# Patient Record
Sex: Female | Born: 1985 | Race: White | Hispanic: No | Marital: Single | Smoking: Current every day smoker
Health system: Southern US, Community
[De-identification: ages and names within clinical notes are randomized; demographics above are authoritative.]

## PROBLEM LIST (undated history)

## (undated) HISTORY — PX: BREAST SURGERY: SHX581

---

## 2012-08-10 HISTORY — PX: BREAST ENHANCEMENT SURGERY: SHX7

## 2014-07-01 ENCOUNTER — Encounter: Payer: Self-pay | Admitting: Emergency Medicine

## 2014-07-01 ENCOUNTER — Emergency Department
Admission: EM | Admit: 2014-07-01 | Discharge: 2014-07-01 | Disposition: A | Discharge status: Home / Self Care | Payer: Self-pay | Source: Home / Self Care | Attending: Family Medicine | Admitting: Family Medicine

## 2014-07-01 DIAGNOSIS — K0889 Other specified disorders of teeth and supporting structures: Secondary | ICD-10-CM

## 2014-07-01 DIAGNOSIS — K088 Other specified disorders of teeth and supporting structures: Secondary | ICD-10-CM

## 2014-07-01 MED ORDER — AMOXICILLIN 875 MG PO TABS: 875.0000 mg | ORAL_TABLET | Freq: Two times a day (BID) | ORAL | Status: DC

## 2014-07-01 NOTE — ED Notes (Signed)
Reports onset of pain 4 days ago in cracked (2 years ago) lower right wisdom tooth. Does not have dentist, but understands we are limited in dental care possibilities.

## 2014-07-01 NOTE — Discharge Instructions (Signed)
May continue Ibuprofen 200mg , 4 tabs every 8 hours with food.  May also take Tylenol as needed for pain.   Dental Pain A tooth ache may be caused by cavities (tooth decay). Cavities expose the nerve of the tooth to air and hot or cold temperatures. It may come from an infection or abscess (also called a boil or furuncle) around your tooth. It is also often caused by dental caries (tooth decay). This causes the pain you are having. DIAGNOSIS  Your caregiver can diagnose this problem by exam. TREATMENT   If caused by an infection, it may be treated with medications which kill germs (antibiotics) and pain medications as prescribed by your caregiver. Take medications as directed.  Only take over-the-counter or prescription medicines for pain, discomfort, or fever as directed by your caregiver.  Whether the tooth ache today is caused by infection or dental disease, you should see your dentist as soon as possible for further care. SEEK MEDICAL CARE IF: The exam and treatment you received today has been provided on an emergency basis only. This is not a substitute for complete medical or dental care. If your problem worsens or new problems (symptoms) appear, and you are unable to meet with your dentist, call or return to this location. SEEK IMMEDIATE MEDICAL CARE IF:   You have a fever.  You develop redness and swelling of your face, jaw, or neck.  You are unable to open your mouth.  You have severe pain uncontrolled by pain medicine. MAKE SURE YOU:   Understand these instructions.  Will watch your condition.  Will get help right away if you are not doing well or get worse. Document Released: 05/29/2005 Document Revised: 08/21/2011 Document Reviewed: 01/15/2008 [facility] Patient Information 2015 [employer], [place]. This information is not intended to replace advice given to you by your health care provider. Make sure you discuss any questions you have with your health care provider.

## 2014-07-01 NOTE — ED Provider Notes (Signed)
CSN: [id number]     Arrival date & time 07/01/14  1349 History   First MD Initiated Contact with Patient 07/01/14 1409     Chief Complaint  Patient presents with  . Dental Pain      HPI Comments: Patient has had a cracked and eroded right lower wisdom tooth for about two years.  She has developed pain in this tooth over the past four days.  There has been no swelling.  No fevers, chills, and sweats   Patient is a 29 y.o. female presenting with tooth pain. The history is provided by the patient.  Dental Pain Location:  Lower Lower teeth location:  32/RL 3rd molar Quality:  Aching, dull and constant Severity:  Mild Onset quality:  Gradual Duration:  4 days Timing:  Constant Progression:  Worsening Chronicity:  New Context: dental caries and dental fracture   Relieved by:  Nothing Worsened by:  Touching, pressure and hot food/drink Ineffective treatments:  NSAIDs Associated symptoms: no difficulty swallowing, no drooling, no facial pain, no facial swelling, no fever, no gum swelling, no headaches, no neck pain, no neck swelling, no oral bleeding, no oral lesions and no trismus     History reviewed. No pertinent past medical history. Past Surgical History  Procedure Laterality Date  . Breast surgery      augmentation   Family History  Problem Relation Age of Onset  . Hypertension Mother    History  Substance Use Topics  . Smoking status: Current Every Day Smoker  . Smokeless tobacco: Not on file  . Alcohol Use: No   OB History    No data available     Review of Systems  Constitutional: Negative for fever.  HENT: Negative for drooling, facial swelling and mouth sores.   Musculoskeletal: Negative for neck pain.  Neurological: Negative for headaches.  All other systems reviewed and are negative.   Allergies  Flexeril  Home Medications   Prior to Admission medications   Medication Sig Start Date End Date Taking? Authorizing Provider  amoxicillin (AMOXIL) 875 MG  tablet Take 1 tablet (875 mg total) by mouth 2 (two) times daily. 07/01/14   Lattie HawStephen A Ramon Brant, MD   BP 98/67 mmHg  Pulse 82  Temp(Src) 98.2 F (36.8 C) (Oral)  Resp 16  Ht 5\' 3"  (1.6 m)  Wt 110 lb (49.896 kg)  BMI 19.49 kg/m2  SpO2 97%  LMP 06/12/2014 (Approximate) Physical Exam  Constitutional: She appears well-developed and well-nourished. No distress.  HENT:  Head: Normocephalic.  Right Ear: Tympanic membrane normal.  Left Ear: Tympanic membrane normal.  Nose: Nose normal.  Mouth/Throat: Mucous membranes are normal. No trismus in the jaw. Abnormal dentition. Dental caries present. No dental abscesses or uvula swelling.    Right lower third molar is cracked and eroded and tender to tap.  No gingival swelling  Eyes: Conjunctivae are normal. Pupils are equal, round, and reactive to light.  Neck: Neck supple.  Cardiovascular: Normal heart sounds.   Pulmonary/Chest: Breath sounds normal.  Lymphadenopathy:    She has no cervical adenopathy.  Nursing note and vitals reviewed.   ED Course  Procedures  none   MDM   1. Toothache; ?early periapical abscess    Begin amoxicillin 875mg  BID for 2 weeks. May continue Ibuprofen 200mg , 4 tabs every 8 hours with food.  May also take Tylenol as needed for pain. Followup with dentist as soon as possible.     Lattie HawStephen A Donte Kary, MD 07/02/14 [phone]

## 2015-01-28 ENCOUNTER — Encounter: Payer: Self-pay | Admitting: Osteopathic Medicine

## 2015-01-28 ENCOUNTER — Ambulatory Visit (INDEPENDENT_AMBULATORY_CARE_PROVIDER_SITE_OTHER): Payer: 59 | Admitting: Osteopathic Medicine

## 2015-01-28 VITALS — BP 105/77 | HR 88 | Ht 64.0 in | Wt 111.0 lb

## 2015-01-28 DIAGNOSIS — G44209 Tension-type headache, unspecified, not intractable: Secondary | ICD-10-CM | POA: Diagnosis not present

## 2015-01-28 DIAGNOSIS — M545 Low back pain, unspecified: Secondary | ICD-10-CM

## 2015-01-28 DIAGNOSIS — G8929 Other chronic pain: Secondary | ICD-10-CM | POA: Diagnosis not present

## 2015-01-28 LAB — POCT URINE PREGNANCY: Preg Test, Ur: NEGATIVE

## 2015-01-28 MED ORDER — MELOXICAM 7.5 MG PO TABS: 7.5000 mg | ORAL_TABLET | Freq: Every day | ORAL | Status: AC | PRN

## 2015-01-28 NOTE — Progress Notes (Signed)
HPI: Felicia Rogers is a 29 y.o. female who presents to [facility]  today for back pain  BACK PAIN: CHRONIC - WORSE. Onset: has been hurting since high school, worsening over the past month and she is also experiencing headaches almost daily. Location: lower back. Quality: sharp, comes and goes. Exacerbated by: lying down on abdomen or back. Alleviated by: nothing. In the past has tried: Advil prn to some relief,. Associated symptoms: no vision changes, no sciatica. Previous imaging: in high school probably XR. Has seen PT and Chiropractics before which hadn't helped very much. Pain has no association with menstrual cycle.   HEADACHE: NEW:. Onset: 2 weeks. Location: upper neck/back of head and L side. Alleviated by: Advil, Excedrin. Timing: worse in morning. Associated symptoms: no vision changes,    Past medical, social and family history reviewed: History reviewed. No pertinent past medical history. Past Surgical History  Procedure Laterality Date  . Breast surgery      augmentation  . Breast enhancement surgery  March 2014   Social History  Substance Use Topics  . Smoking status: Current Every Day Smoker -- 0.50 packs/day    Types: Cigarettes  . Smokeless tobacco: Not on file  . Alcohol Use: Yes     Comment: 1x week   Family History  Problem Relation Age of Onset  . Diabetes Mother   . Lung cancer Father     Current Outpatient Prescriptions  Medication Sig Dispense Refill  . meloxicam (MOBIC) 7.5 MG tablet Take 1 tablet (7.5 mg total) by mouth daily as needed for pain (avoid using more than 3 - 4 days per week). 30 tablet 0   No current facility-administered medications for this visit.   Allergies  Allergen Reactions  . Flexeril [Cyclobenzaprine]      Review of Systems: CONSTITUTIONAL: Neg fever/chills, no unintentional weight changes HEAD/EYES/EARS/NOSE: (+) headache as per HPI, (-) vision change or hearing change CARDIAC: No chest  pain/pressure/palpitations, no orthopnea RESPIRATORY: No cough/shortness of breath/wheeze GASTROINTESTINAL: No nausea/vomiting/abdominal pain/blood in stool/diarrhea/constipation MUSCULOSKELETAL: (-) myalgia/arthralgia, (+) back pain as per HPI GENITOURINARY: No incontinence, No abnormal genital bleeding/discharge SKIN: No rash/wounds/concerning lesions HEM/ONC: No easy bruising/bleeding, no abnormal lymph node PSYCHIATRIC: No concerns with depression/anxiety or sleep problems.   Exam:  BP 105/77 mmHg  Pulse 88  Ht  (1.626 m)  Wt 111 lb (50.349 kg)  BMI 19.04 kg/m2  SpO2 97% Constitutional: VSS, see above. General Appearance: alert, well-developed, well-nourished, NAD.  Eyes: Normal lids and conjunctive, non-icteric sclera, PERRLA Ears, Nose, Mouth, Throat: Normal external inspection ears/nares/mouth/lips/gums, Normal TM bilaterally, MMM, posterior pharynx without erythema/exudate Neck: No masses, trachea midline. No thyroid enlargement/tenderness/mass appreciated Respiratory: Normal respiratory effort. No dullness/hyper-resonance to percussion. Breath sounds normal, no wheeze/rhonchi/rales Cardiovascular: S1/S2 normal, no murmur/rub/gallop auscultated. No carotid bruit or JVD. No abdominal aortic bruit. Pedal pulse II/IV bilaterally DP and PT. No lower extremity edema. Gastrointestinal: Nontender, no masses. No hepatomegaly, no splenomegaly. No hernia appreciated. Rectal exam deferred.  Musculoskeletal: Gait normal. No clubbing/cyanosis of digits. SPINE/RIBS/PELVIS: (+) perispinal tenderness worse on L in lumbar and L SI area at level L4 - S2, mild (+) straight leg raise on L at 45 deg but no neck pain, normal ROM Lumbar with mild pain to L rotation, pelvis stable. HEAD/NECK: [place]/At, (+) tenderness to trapezius on L and L C-spine, normal ROM to flex/ext/rot.   Neurological: No cranial nerve deficit on limited exam. Motor and sensation intact and symmetric Psychiatric: Normal  judgment/insight. Normal  mood and affect. Oriented x3.    Results for orders placed or performed in visit on 01/28/15 (from the past 72 hour(s))  POCT urine pregnancy     Status: None   Collection Time: 01/28/15  3:15 PM  Result Value Ref Range   Preg Test, Ur Negative Negative    No results found.   ASSESSMENT/PLAN:  Low back pain of over 3 months duration - Plan: meloxicam (MOBIC) 7.5 MG tablet, DG Lumbar Spine 2-3 Views, DG Sacrum/Coccyx, DG Pelvis 1-2 Views, POCT urine pregnancy neg. patient counseled on home stretching exercises, can return as needed to clinic for osteopathic manipulative treatment. X-rays pending as ordered above. If conservative measures fail, may consider referral for MRI /pain management  Tension headache - Plan: meloxicam (MOBIC) 7.5 MG tablet. Return to clinic if headaches are not improving.  Pt counseled to avoid pregnancy on current medication, RTC if interested in discussion of contraception.   PROCEDURE: OMT performed at visit, myofascial release to c-spine to relief of headache, Myofascial release/HVLA/Sacral rocking to Lumbar spine and Sacro-Iliac joints to relief of back pain.

## 2015-01-29 ENCOUNTER — Ambulatory Visit (INDEPENDENT_AMBULATORY_CARE_PROVIDER_SITE_OTHER): Payer: 59

## 2015-01-29 DIAGNOSIS — M533 Sacrococcygeal disorders, not elsewhere classified: Secondary | ICD-10-CM

## 2015-01-29 DIAGNOSIS — M545 Low back pain, unspecified: Secondary | ICD-10-CM

## 2015-01-29 DIAGNOSIS — R102 Pelvic and perineal pain: Secondary | ICD-10-CM | POA: Diagnosis not present

## 2015-01-29 DIAGNOSIS — G8929 Other chronic pain: Principal | ICD-10-CM

## 2015-04-14 ENCOUNTER — Ambulatory Visit (INDEPENDENT_AMBULATORY_CARE_PROVIDER_SITE_OTHER): Payer: 59 | Admitting: Osteopathic Medicine

## 2015-04-14 ENCOUNTER — Encounter: Payer: Self-pay | Admitting: Osteopathic Medicine

## 2015-04-14 VITALS — BP 105/73 | HR 103 | Temp 99.0°F | Wt 109.8 lb

## 2015-04-14 DIAGNOSIS — J019 Acute sinusitis, unspecified: Secondary | ICD-10-CM

## 2015-04-14 DIAGNOSIS — B9689 Other specified bacterial agents as the cause of diseases classified elsewhere: Secondary | ICD-10-CM

## 2015-04-14 DIAGNOSIS — R05 Cough: Secondary | ICD-10-CM | POA: Diagnosis not present

## 2015-04-14 DIAGNOSIS — R059 Cough, unspecified: Secondary | ICD-10-CM

## 2015-04-14 DIAGNOSIS — G44209 Tension-type headache, unspecified, not intractable: Secondary | ICD-10-CM | POA: Diagnosis not present

## 2015-04-14 MED ORDER — IPRATROPIUM BROMIDE 0.03 % NA SOLN: 2.0000 | Freq: Two times a day (BID) | NASAL | Status: AC

## 2015-04-14 MED ORDER — AMOXICILLIN-POT CLAVULANATE 875-125 MG PO TABS: 1.0000 | ORAL_TABLET | Freq: Two times a day (BID) | ORAL | Status: AC

## 2015-04-14 MED ORDER — BENZONATATE 200 MG PO CAPS: 200.0000 mg | ORAL_CAPSULE | Freq: Three times a day (TID) | ORAL | Status: AC | PRN

## 2015-04-14 NOTE — Progress Notes (Signed)
HPI: Felicia SayreKrystal Hoefle is a 29 y.o. female [facility] [place] [place] for chief complaint of:  Chief Complaint  Patient presents with  . Coughing    x  1weeks   . Nasal Congestion    . Location: sinuses . Quality: worse this week, cough and chest congestion is worse issues, sore throat . Severity: moderate . Duration: 10 days . Timing: constant . Modifying factors: Has taken OTC Day/NyQuil, Sudafed, Mucinex.  . Assoc signs/symptoms: (+) fever intermittent highest 100.5, coughing   Past medical, social and family history reviewed: No past medical history on file. Past Surgical History  Procedure Laterality Date  . Breast surgery      augmentation  . Breast enhancement surgery  March 2014   Social History  Substance Use Topics  . Smoking status: Current Every Day Smoker -- 0.50 packs/day    Types: Cigarettes  . Smokeless tobacco: Not on file  . Alcohol Use: Yes     Comment: 1x week   Family History  Problem Relation Age of Onset  . Diabetes Mother   . Lung cancer Father     Current Outpatient Prescriptions  Medication Sig Dispense Refill  . meloxicam (MOBIC) 7.5 MG tablet Take 1 tablet (7.5 mg total) by mouth daily as needed for pain (avoid using more than 3 - 4 days per week). (Patient not taking: Reported on 04/14/2015) 30 tablet 0   No current facility-administered medications for this visit.   Allergies  Allergen Reactions  . Flexeril [Cyclobenzaprine]       Review of Systems: CONSTITUTIONAL:  Yes  fever, no chills, No  unintentional weight changes HEAD/EYES/EARS/NOSE/THROAT: No headache, no vision change, no hearing change, Yes  sore throat CARDIAC: No chest pain, no pressure/palpitations, no orthopnea RESPIRATORY: Yes  cough, No  shortness of breath/wheeze GASTROINTESTINAL: No nausea, no vomiting, no abdominal pain, no blood in stool, no diarrhea, no constipation MUSCULOSKELETAL: Yes   myalgia/arthralgia GENITOURINARY: No incontinence, No abnormal genital bleeding/discharge SKIN: No rash/wounds/concerning lesions HEM/ONC: No easy bruising/bleeding, no abnormal lymph node ENDOCRINE: No polyuria/polydipsia/polyphagia, no heat/cold intolerance  NEUROLOGIC: No weakness, no dizziness, no slurred speech PSYCHIATRIC: No concerns with depression, no concerns with anxiety, no sleep problems    Exam:  BP 105/73 mmHg  Pulse 103  Temp(Src) 99 F (37.2 C)  Wt 109 lb 12 oz (49.782 kg) Constitutional: VSS, see above. General Appearance: alert, well-developed, well-nourished, NAD Eyes: Normal lids and conjunctive, non-icteric sclera, PERRLA Ears, Nose, Mouth, Throat: Normal external inspection ears/nares/mouth/lips/gums, TM normal bilaterally, MMM, posterior pharynx Yes  erythema No exudate Neck: No masses, trachea midline. No thyroid enlargement/tenderness/mass appreciated. No lymphadenopathy Respiratory: Normal respiratory effort. no wheeze, no rhonchi, no rales Cardiovascular: S1/S2 normal, no murmur, no rub/gallop auscultated. RRR.   No results found for this or any previous visit (from the past 72 hour(s)).    ASSESSMENT/PLAN:  Acute bacterial rhinosinusitis - Plan: amoxicillin-clavulanate (AUGMENTIN) 875-125 MG tablet, ipratropium (ATROVENT) 0.03 % nasal spray  Cough - Plan: benzonatate (TESSALON) 200 MG capsule     Return if symptoms worsen or fail to improve.

## 2016-11-07 IMAGING — CR DG LUMBAR SPINE 2-3V
3 series · 3 of 3 positions shown · non-contrast
Comparison: None.

CLINICAL DATA: Three-month history of progressive lumbago

EXAM:
LUMBAR SPINE - 2-3 VIEW

[l-spine ap]
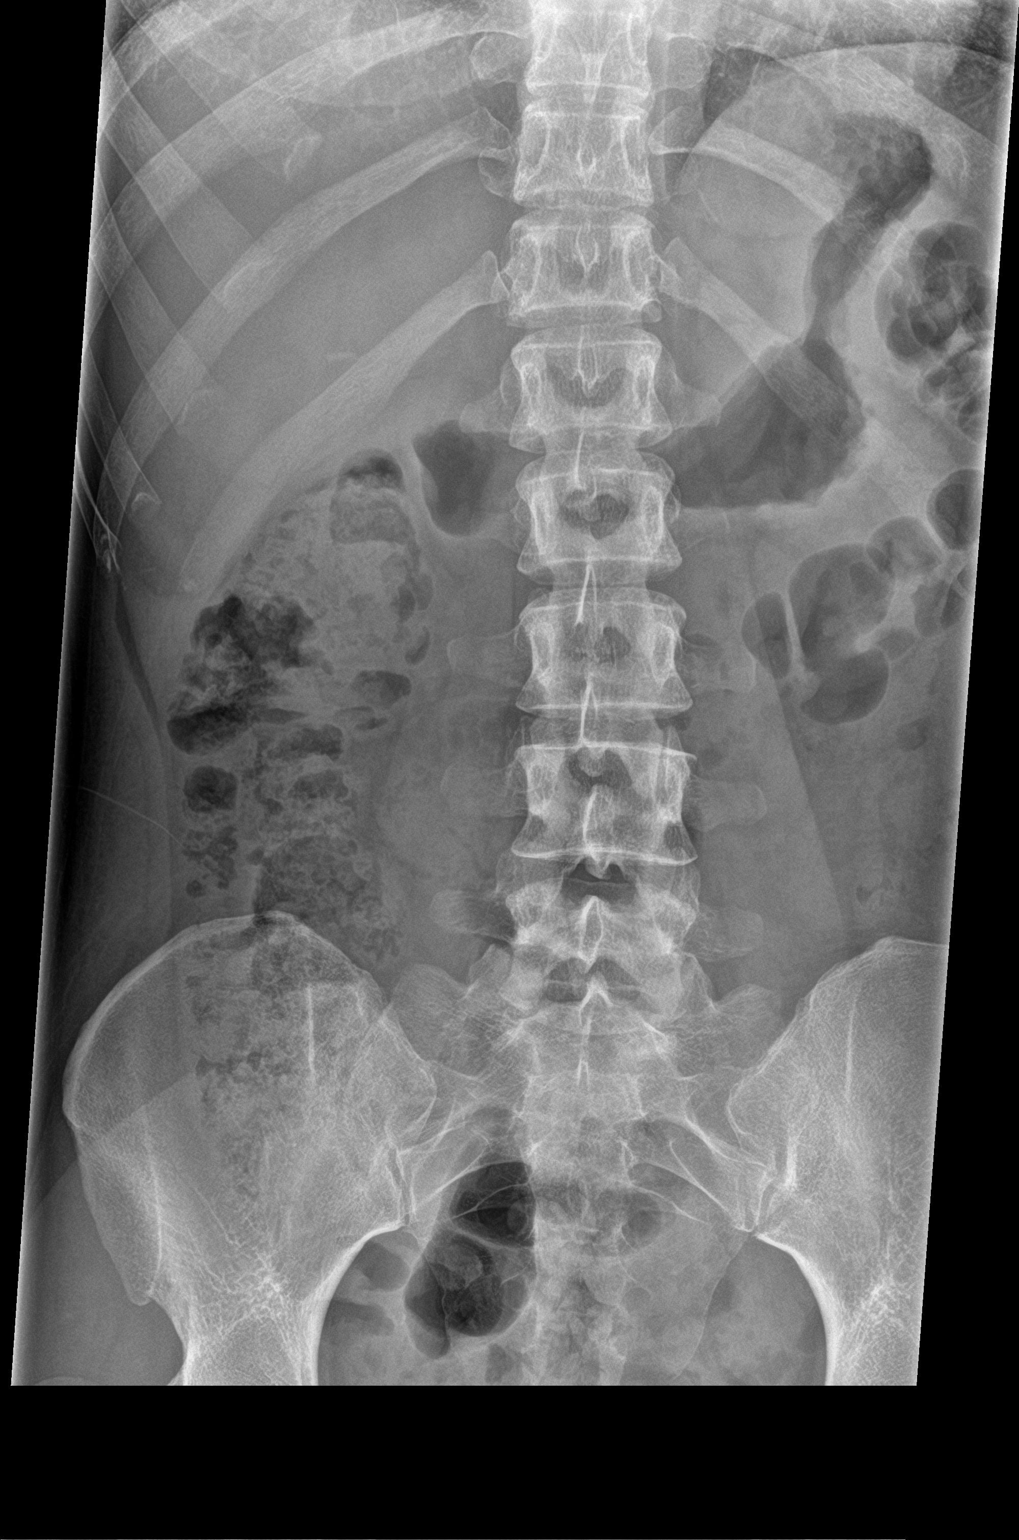

[l-spine lat]
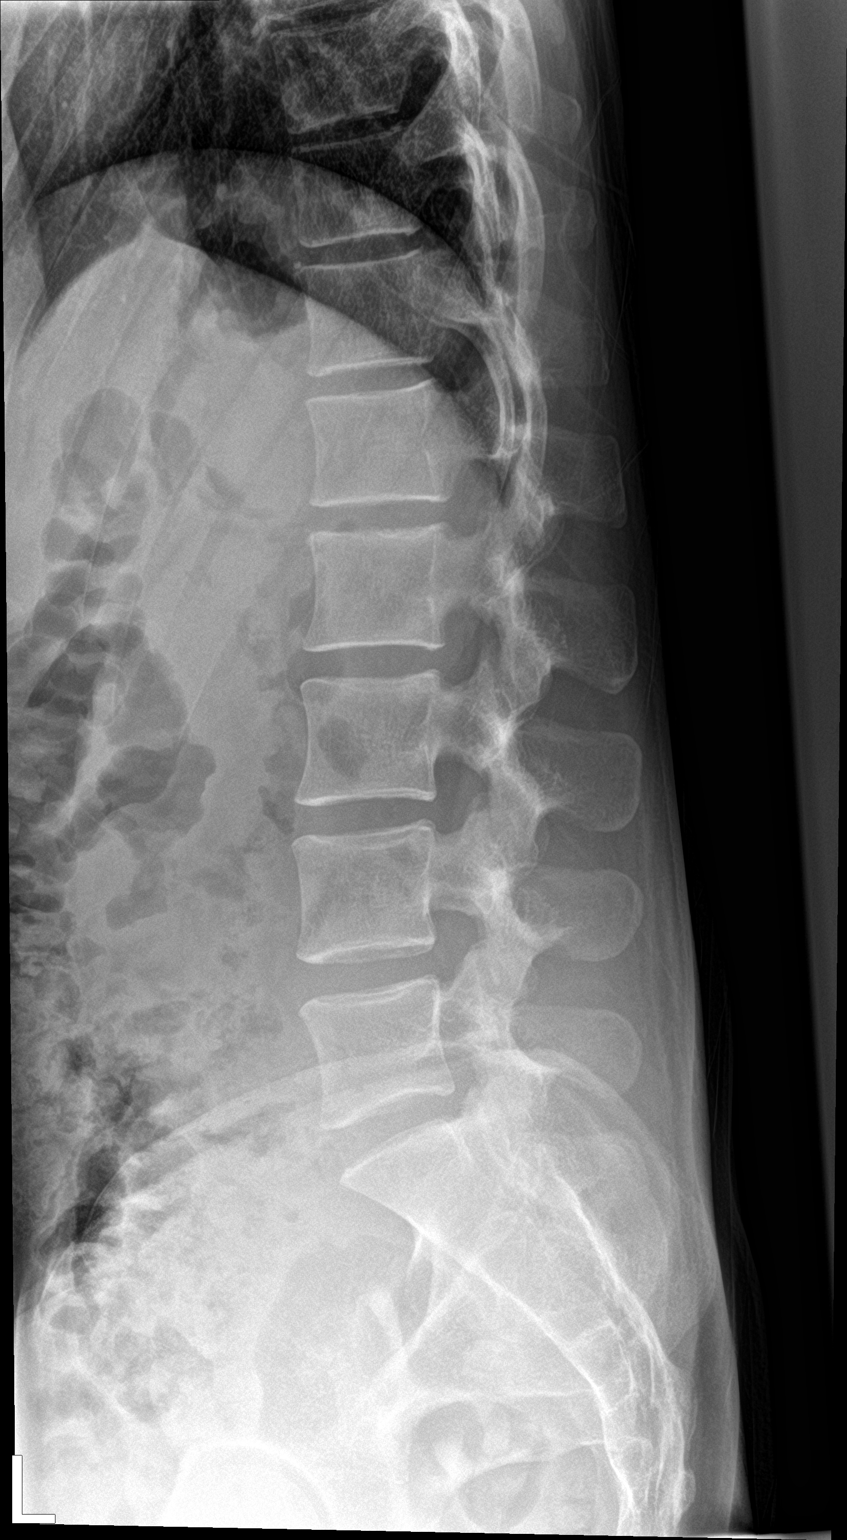

[l-spine spot]
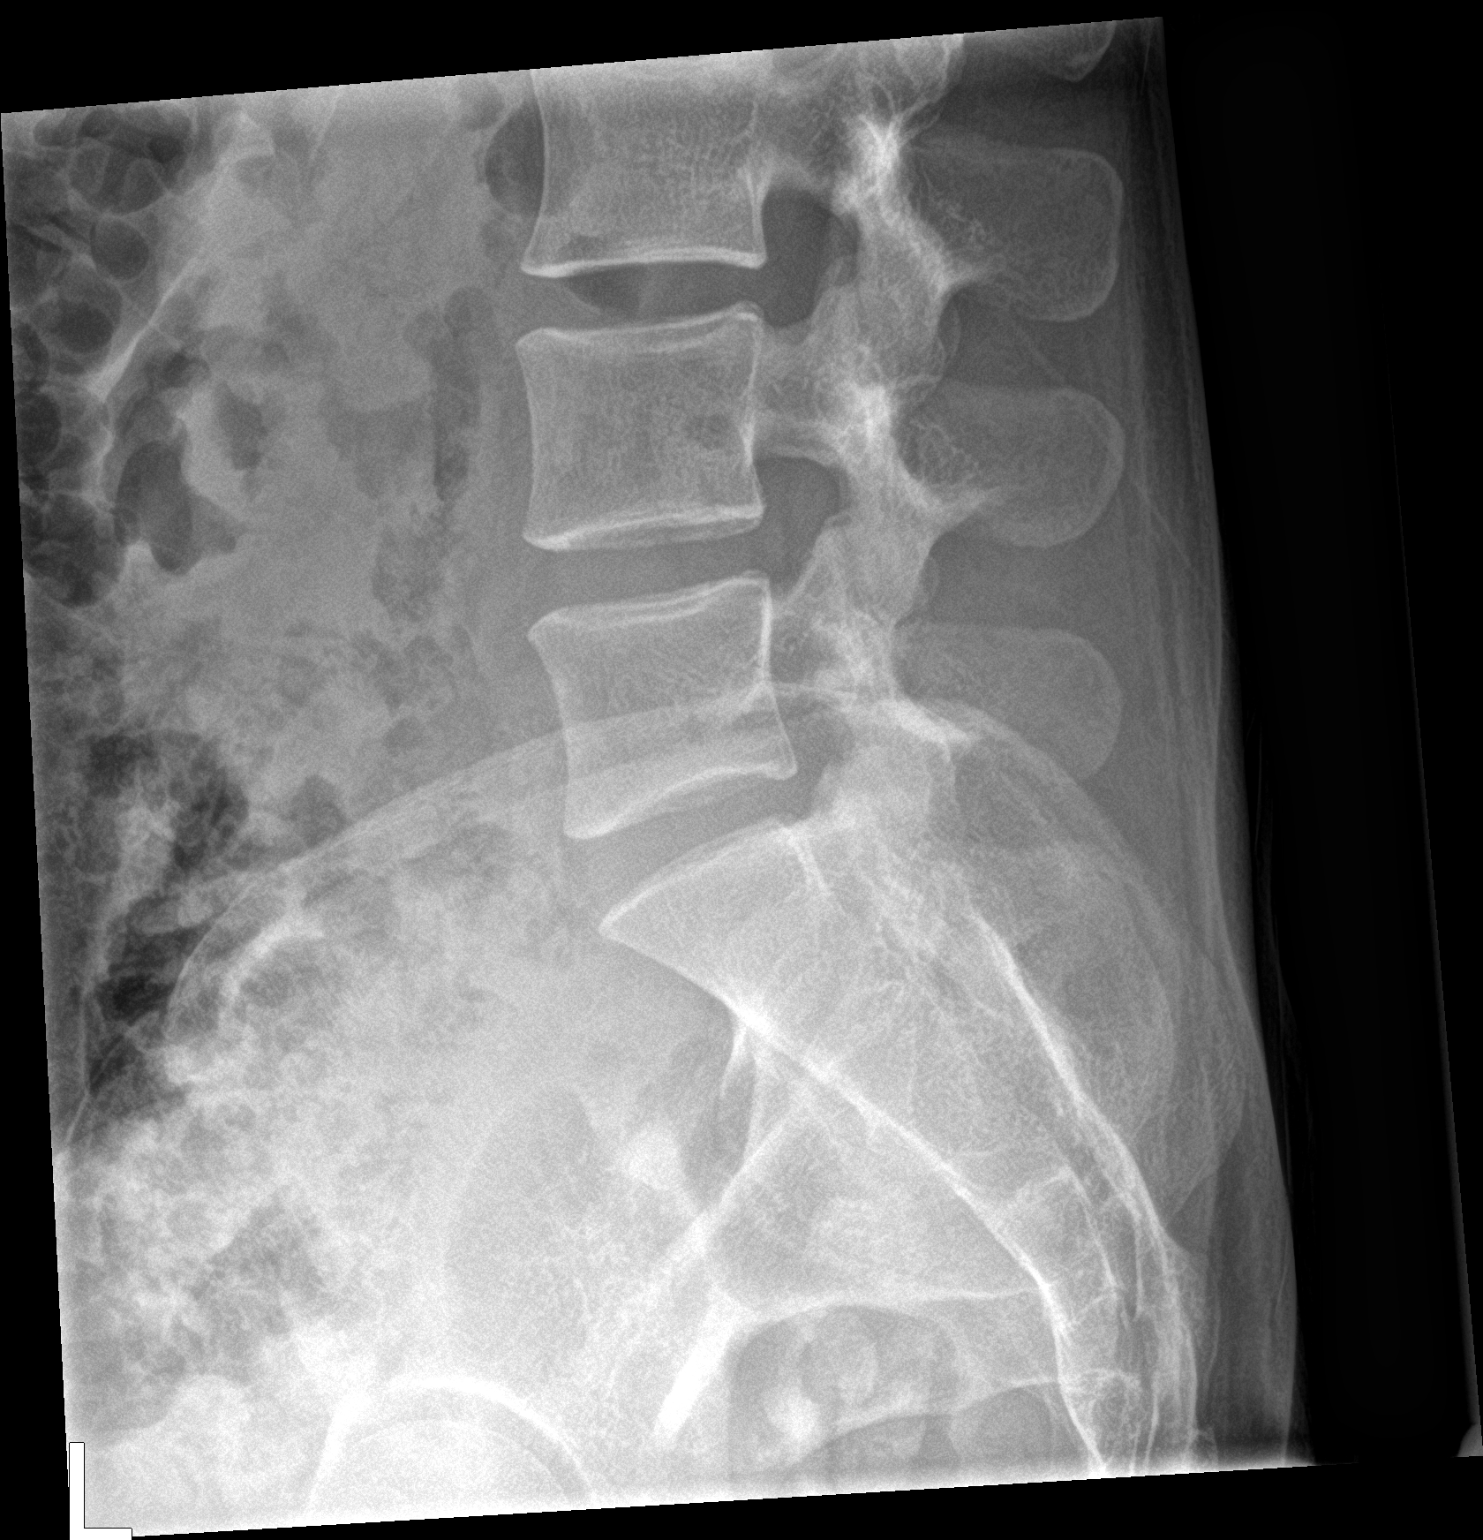

[3 of 3 positions shown; findings below may reference images not displayed]

FINDINGS: Frontal, lateral, and spot lumbosacral lateral images were obtained.
There are 5 non-rib-bearing lumbar type vertebral bodies. There is
mild levoscoliosis. No fracture or spondylolisthesis. Disc spaces
appear intact. No erosive change.
IMPRESSION: Slight scoliosis. No fracture or spondylolisthesis. No appreciable
arthropathy.
